# Patient Record
Sex: Female | Born: 1960 | Race: Black or African American | Hispanic: No
Health system: Southern US, Community
[De-identification: ages and names within clinical notes are randomized; demographics above are authoritative.]

---

## 2016-09-07 ENCOUNTER — Emergency Department (HOSPITAL_COMMUNITY): Payer: Self-pay

## 2016-09-07 ENCOUNTER — Emergency Department (HOSPITAL_COMMUNITY)
Admission: EM | Admit: 2016-09-07 | Discharge: 2016-09-07 | Disposition: A | Discharge status: Home / Self Care | Payer: Self-pay | Attending: Emergency Medicine | Admitting: Emergency Medicine

## 2016-09-07 DIAGNOSIS — T148XXA Other injury of unspecified body region, initial encounter: Secondary | ICD-10-CM

## 2016-09-07 DIAGNOSIS — S91311A Laceration without foreign body, right foot, initial encounter: Secondary | ICD-10-CM | POA: Insufficient documentation

## 2016-09-07 DIAGNOSIS — Y999 Unspecified external cause status: Secondary | ICD-10-CM | POA: Insufficient documentation

## 2016-09-07 DIAGNOSIS — Y939 Activity, unspecified: Secondary | ICD-10-CM | POA: Insufficient documentation

## 2016-09-07 DIAGNOSIS — S060X9A Concussion with loss of consciousness of unspecified duration, initial encounter: Secondary | ICD-10-CM | POA: Insufficient documentation

## 2016-09-07 DIAGNOSIS — R933 Abnormal findings on diagnostic imaging of other parts of digestive tract: Secondary | ICD-10-CM | POA: Insufficient documentation

## 2016-09-07 DIAGNOSIS — Y92410 Unspecified street and highway as the place of occurrence of the external cause: Secondary | ICD-10-CM | POA: Insufficient documentation

## 2016-09-07 LAB — CBC
HCT: 40.3 % (ref 36.0–46.0)
HEMOGLOBIN: 12.6 g/dL (ref 12.0–15.0)
MCH: 27.8 pg (ref 26.0–34.0)
MCHC: 31.3 g/dL (ref 30.0–36.0)
MCV: 89 fL (ref 78.0–100.0)
Platelets: 402 10*3/uL — ABNORMAL HIGH (ref 150–400)
RBC: 4.53 MIL/uL (ref 3.87–5.11)
RDW: 14.8 % (ref 11.5–15.5)
WBC: 5.4 10*3/uL (ref 4.0–10.5)

## 2016-09-07 LAB — I-STAT CHEM 8, ED
BUN: 9 mg/dL (ref 6–20)
CALCIUM ION: 1.17 mmol/L (ref 1.15–1.40)
Chloride: 107 mmol/L (ref 101–111)
Creatinine, Ser: 0.8 mg/dL (ref 0.44–1.00)
Glucose, Bld: 94 mg/dL (ref 65–99)
HEMATOCRIT: 41 % (ref 36.0–46.0)
HEMOGLOBIN: 13.9 g/dL (ref 12.0–15.0)
Potassium: 3.6 mmol/L (ref 3.5–5.1)
SODIUM: 143 mmol/L (ref 135–145)
TCO2: 27 mmol/L (ref 0–100)

## 2016-09-07 MED ORDER — HYDROCODONE-ACETAMINOPHEN 5-325 MG PO TABS
1.0000 | ORAL_TABLET | ORAL | Status: AC
  Administered 2016-09-07: 1 via ORAL
  Filled 2016-09-07: qty 1

## 2016-09-07 MED ORDER — IOPAMIDOL (ISOVUE-300) INJECTION 61%
INTRAVENOUS | Status: AC
  Administered 2016-09-07: 100 mL via INTRAVENOUS
  Filled 2016-09-07: qty 100

## 2016-09-07 MED ORDER — LIDOCAINE-EPINEPHRINE (PF) 2 %-1:200000 IJ SOLN
10.0000 mL | Freq: Once | INTRAMUSCULAR | Status: AC
  Administered 2016-09-07: 10 mL
  Filled 2016-09-07: qty 20

## 2016-09-07 MED ORDER — TETANUS-DIPHTH-ACELL PERTUSSIS 5-2.5-18.5 LF-MCG/0.5 IM SUSP
0.5000 mL | Freq: Once | INTRAMUSCULAR | Status: AC
  Administered 2016-09-07: 0.5 mL via INTRAMUSCULAR
  Filled 2016-09-07: qty 0.5

## 2016-09-07 MED ORDER — TRAMADOL HCL 50 MG PO TABS: 50.0000 mg | ORAL_TABLET | Freq: Four times a day (QID) | ORAL | 0 refills | Status: AC | PRN
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
## 2016-09-07 NOTE — ED Notes (Signed)
Social work at bedside.  

## 2016-09-07 NOTE — Progress Notes (Signed)
Orthopedic Tech Progress Note Patient Details:  Monique Oneal May 23, 1960 [id number]  Patient ID: Monique Oneal, female   DOB: May 23, 1960, 56 y.o.   MRN: [id number]   [name], [name] 09/07/2016, 12:27 PM Made level 2 trauma visit
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
## 2016-09-07 NOTE — ED Notes (Signed)
Patient given meal, bag of dressing change supplies, taxi voucher and pants and shoes.
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
## 2016-09-07 NOTE — ED Notes (Signed)
Patient at CT
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
## 2016-09-07 NOTE — Discharge Instructions (Addendum)
Suture removal in 7-10 days 

## 2016-09-07 NOTE — ED Notes (Signed)
Case management at bedside.
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
